# Patient Record
Sex: Male | Born: 1985 | Race: Black or African American | Hispanic: No | Marital: Single | Smoking: Never smoker
Health system: Southern US, Community
[De-identification: ages and names within clinical notes are randomized; demographics above are authoritative.]

## PROBLEM LIST (undated history)

## (undated) HISTORY — PX: TONSILLECTOMY: SUR1361

---

## 2020-05-15 ENCOUNTER — Emergency Department
Admission: EM | Admit: 2020-05-15 | Discharge: 2020-05-15 | Disposition: A | Discharge status: Home / Self Care | Payer: Self-pay | Attending: Emergency Medicine | Admitting: Emergency Medicine

## 2020-05-15 ENCOUNTER — Encounter: Payer: Self-pay | Admitting: Emergency Medicine

## 2020-05-15 ENCOUNTER — Other Ambulatory Visit: Payer: Self-pay

## 2020-05-15 ENCOUNTER — Emergency Department: Payer: Self-pay

## 2020-05-15 DIAGNOSIS — J209 Acute bronchitis, unspecified: Secondary | ICD-10-CM

## 2020-05-15 DIAGNOSIS — Z20822 Contact with and (suspected) exposure to covid-19: Secondary | ICD-10-CM | POA: Insufficient documentation

## 2020-05-15 LAB — RESP PANEL BY RT-PCR (FLU A&B, COVID) ARPGX2
Influenza A by PCR: NEGATIVE
Influenza B by PCR: NEGATIVE
SARS Coronavirus 2 by RT PCR: NEGATIVE

## 2020-05-15 MED ORDER — BENZONATATE 200 MG PO CAPS: 200.0000 mg | ORAL_CAPSULE | Freq: Three times a day (TID) | ORAL | 0 refills | Status: DC | PRN

## 2020-05-15 MED ORDER — AZITHROMYCIN 250 MG PO TABS: ORAL_TABLET | ORAL | 0 refills | Status: DC

## 2020-05-15 MED ORDER — PREDNISONE 10 MG (21) PO TBPK: ORAL_TABLET | ORAL | 0 refills | Status: AC

## 2020-05-15 NOTE — ED Notes (Signed)
See triage note  Presents with cough,sinus pressure and fever   Sx's started couple of days ago  Currently afebrile

## 2020-05-15 NOTE — ED Provider Notes (Signed)
[facility] Emergency Department Provider Note  ____________________________________________   Event Date/Time   First MD Initiated Contact with Patient 05/15/20 1142     (approximate)  I have reviewed the triage vital signs and the nursing notes.   HISTORY  Chief Complaint URI    HPI Kelley Knoth is a 35 y.o. male presents to the emergency department with URI symptoms for 14 days.   Is complaining of cough, congestion, fever, chills, denies chest pain, shortness of breath close contact with Covid19+ patient, patient states that he needs a work note as he has been awake for the past 2 weeks.  States at this point I feel like I need an antibiotic because it is going on for so long.  He denies chest pain/shortness of breath at this time   History reviewed. No pertinent past medical history.  There are no problems to display for this patient.   Past Surgical History:  Procedure Laterality Date  . TONSILLECTOMY      Prior to Admission medications   Medication Sig Start Date End Date Taking? Authorizing Provider  azithromycin (ZITHROMAX Z-PAK) 250 MG tablet 2 pills today then 1 pill a day for 4 days 05/15/20  Yes Alvis Edgell, Roselyn Bering, PA-C  benzonatate (TESSALON) 200 MG capsule Take 1 capsule (200 mg total) by mouth 3 (three) times daily as needed for cough. 05/15/20  Yes Korin Setzler, Roselyn Bering, PA-C  predniSONE (STERAPRED UNI-PAK 21 TAB) 10 MG (21) TBPK tablet Take 6 pills on day one then decrease by 1 pill each day 05/15/20  Yes Faythe Ghee, PA-C    Allergies Patient has no known allergies.  No family history on file.  Social History Social History   Tobacco Use  . Smoking status: Never Smoker  . Smokeless tobacco: Never Used    Review of Systems  Constitutional: Positive fever/chills Eyes: No visual changes. ENT: No sore throat. Respiratory: Positive cough Cardiovascular: No chest pain Gastrointestinal: No abdominal pain Genitourinary: Negative  for dysuria. Musculoskeletal: Negative for back pain. Skin: Negative for rash. Neurological: no neurological changes    ____________________________________________   PHYSICAL EXAM:  VITAL SIGNS: ED Triage Vitals  Enc Vitals Group     BP 05/15/20 1152 138/78     Pulse Rate 05/15/20 1152 76     Resp 05/15/20 1152 15     Temp 05/15/20 1152 98.2 F (36.8 C)     Temp Source 05/15/20 1152 Oral     SpO2 05/15/20 1152 100 %     Weight 05/15/20 1139 210 lb (95.3 kg)     Height 05/15/20 1139 5\' 10"  (1.778 m)     Head Circumference --      Peak Flow --      Pain Score 05/15/20 1152 8     Pain Loc --      Pain Edu? --      Excl. in GC? --     Constitutional: Alert and oriented. Well appearing and in no acute distress. Eyes: Conjunctivae are normal.  Head: Atraumatic. Nose: No congestion/rhinnorhea. Mouth/Throat: Mucous membranes are moist.   Neck:  supple no lymphadenopathy noted Cardiovascular: Normal rate, regular rhythm. Heart sounds are normal Respiratory: Normal respiratory effort.  No retractions, lungs CTA GU: deferred Musculoskeletal: FROM all extremities, warm and well perfused Neurologic:  Normal speech and language.  Skin:  Skin is warm, dry and intact. No rash noted. Psychiatric: Mood and affect are normal. Speech and behavior are normal.  ____________________________________________  LABS (all labs ordered are listed, but only abnormal results are displayed)  Labs Reviewed  RESP PANEL BY RT-PCR (FLU A&B, COVID) ARPGX2   ____________________________________________   ____________________________________________  RADIOLOGY  Chest x-ray  ____________________________________________   PROCEDURES  Procedure(s) performed: No  Procedures    ____________________________________________   INITIAL IMPRESSION / ASSESSMENT AND PLAN / ED COURSE  Pertinent labs & imaging results that were available during my care of the patient were reviewed by me  and considered in my medical decision making (see chart for details).   Patient is a 35 year old male who complains of URI symptoms.  Exam is consistent with covid.    Negative test for covid/flu  Chest x-ray reviewed by me confirmed by radiology to appear normal.  Did explain the findings to the patient.  He was given a prescription for Z-Pak, Tessalon Perles, Sterapred.  He is to follow-up with his regular doctor if not improving in 2 to 3 days.  He was given a work note.  He is discharged stable condition.   The patient was instructed to quarantine themselves at home.  Follow-up with your regular doctor if any concerns.  Return emergency department for worsening. OTC measures discussed     Toy Eisemann was evaluated in Emergency Department on 05/15/2020 for the symptoms described in the history of present illness. He was evaluated in the context of the global COVID-19 pandemic, which necessitated consideration that the patient might be at risk for infection with the SARS-CoV-2 virus that causes COVID-19. Institutional protocols and algorithms that pertain to the evaluation of patients at risk for COVID-19 are in a state of rapid change based on information released by regulatory bodies including the CDC and federal and state organizations. These policies and algorithms were followed during the patient's care in the ED.   As part of my medical decision making, I reviewed the following data within the electronic MEDICAL RECORD NUMBER Nursing notes reviewed and incorporated, Labs reviewed , Old chart reviewed, Radiograph reviewed , Notes from prior ED visits and [place] Controlled Substance Database  ____________________________________________   FINAL CLINICAL IMPRESSION(S) / ED DIAGNOSES  Final diagnoses:  Acute bronchitis, unspecified organism      NEW MEDICATIONS STARTED DURING THIS VISIT:  New Prescriptions   AZITHROMYCIN (ZITHROMAX Z-PAK) 250 MG TABLET    2 pills today then 1 pill a day  for 4 days   BENZONATATE (TESSALON) 200 MG CAPSULE    Take 1 capsule (200 mg total) by mouth 3 (three) times daily as needed for cough.   PREDNISONE (STERAPRED UNI-PAK 21 TAB) 10 MG (21) TBPK TABLET    Take 6 pills on day one then decrease by 1 pill each day     Note:  This document was prepared using Dragon voice recognition software and may include unintentional dictation errors.    Faythe Ghee, PA-C 05/15/20 1331    Merwyn Katos, MD 05/20/20 [phone]

## 2020-05-15 NOTE — ED Triage Notes (Signed)
C/O cough, chills, sinus congestion, fever x 2 weeks.

## 2020-07-24 ENCOUNTER — Emergency Department
Admission: EM | Admit: 2020-07-24 | Discharge: 2020-07-24 | Disposition: A | Discharge status: Home / Self Care | Payer: BLUE CROSS/BLUE SHIELD | Attending: Emergency Medicine | Admitting: Emergency Medicine

## 2020-07-24 ENCOUNTER — Emergency Department: Payer: BLUE CROSS/BLUE SHIELD

## 2020-07-24 ENCOUNTER — Other Ambulatory Visit: Payer: Self-pay

## 2020-07-24 DIAGNOSIS — M5412 Radiculopathy, cervical region: Secondary | ICD-10-CM | POA: Insufficient documentation

## 2020-07-24 DIAGNOSIS — M4802 Spinal stenosis, cervical region: Secondary | ICD-10-CM | POA: Insufficient documentation

## 2020-07-24 DIAGNOSIS — M542 Cervicalgia: Secondary | ICD-10-CM | POA: Diagnosis present

## 2020-07-24 MED ORDER — OXYCODONE-ACETAMINOPHEN 5-325 MG PO TABS
1.0000 | ORAL_TABLET | Freq: Once | ORAL | Status: AC
  Administered 2020-07-24: 1 via ORAL
  Filled 2020-07-24: qty 1

## 2020-07-24 MED ORDER — GABAPENTIN 300 MG PO CAPS: 300.0000 mg | ORAL_CAPSULE | Freq: Every day | ORAL | 0 refills | Status: AC

## 2020-07-24 NOTE — ED Provider Notes (Signed)
[facility] EMERGENCY DEPARTMENT Provider Note   CSN: [id number] Arrival date & time: 07/24/20  1452     History Chief Complaint  Patient presents with   Torticollis    Jason David is a 35 y.o. male presents to the emergency department for evaluation of left-sided neck pain, burning, weakness and numbness in the left arm.  6 months ago patient lifted a bag at work, felt pain and discomfort on the left side of his neck and into the left shoulder.  Symptoms have been stable up until 3 weeks ago when he developed weakness and numbness in the left arm, patient states his left hand is numb and he has pain numbness and tingling shooting down the left arm.  He has weakness with lifting his shoulder as well as performing a bicep curl.  He has decreased grip strength on the left side.  He denies any chest pain, shortness of breath or back pain.  He is currently on a prednisone taper started several days ago along with a muscle relaxer with little relief.  Pain is moderate to severe and has been increasing.  He denies any fevers.  Did have x-rays performed by orthopedic urgent care but are not HPI     History reviewed. No pertinent past medical history.  There are no problems to display for this patient.   Past Surgical History:  Procedure Laterality Date   TONSILLECTOMY         No family history on file.  Social History   Tobacco Use   Smoking status: Never   Smokeless tobacco: Never  Substance Use Topics   Alcohol use: Yes   Drug use: Never    Home Medications Prior to Admission medications   Medication Sig Start Date End Date Taking? Authorizing Provider  gabapentin (NEURONTIN) 300 MG capsule Take 1 capsule (300 mg total) by mouth at bedtime. 07/24/20 07/24/21 Yes Evon Slack, PA-C  methocarbamol (ROBAXIN) 500 MG tablet Take 500 mg by mouth 4 (four) times daily.   Yes [provider]  predniSONE (STERAPRED UNI-PAK 21 TAB) 10 MG (21) TBPK tablet  Take 6 pills on day one then decrease by 1 pill each day 05/15/20   Faythe Ghee, PA-C    Allergies    Ivp dye [iodinated diagnostic agents]  Review of Systems   Review of Systems  Constitutional:  Negative for chills and fever.  Respiratory:  Negative for shortness of breath.   Cardiovascular:  Negative for chest pain and palpitations.  Gastrointestinal:  Negative for nausea and vomiting.  Musculoskeletal:  Positive for neck pain. Negative for back pain.  Skin:  Negative for rash.  Neurological:  Positive for weakness and numbness. Negative for dizziness, syncope, light-headedness and headaches.   Physical Exam Updated Vital Signs BP 127/81   Pulse 84   Temp 98.6 F (37 C) (Oral)   Resp 19   Ht 6\' 2"  (1.88 m)   Wt 102.1 kg   SpO2 93%   BMI 28.89 kg/m   Physical Exam Constitutional:      Appearance: He is well-developed.  HENT:     Head: Normocephalic and atraumatic.  Eyes:     Conjunctiva/sclera: Conjunctivae normal.  Cardiovascular:     Rate and Rhythm: Normal rate.  Pulmonary:     Effort: Pulmonary effort is normal. No respiratory distress.  Musculoskeletal:     Cervical back: Normal range of motion.     Comments: Limited range of motion of the cervical  spine the left-sided paravertebral muscle tenderness.  He has inability to fully extend the neck, neck extension reproduces left arm/hand numbness and tingling.  Patient has decreased supraspinatus, biceps triceps and grip strength on the left side.  He has diminished sensation on the dorsal aspect of the left hand mostly along the middle finger.  He has 2+ radial pulse.  Compartments are soft.  Good internal and external rotation with no discomfort of the shoulder.  Skin:    General: Skin is warm.     Findings: No rash.  Neurological:     Mental Status: He is alert and oriented to person, place, and time.  Psychiatric:        Behavior: Behavior normal.        Thought Content: Thought content normal.    ED  Results / Procedures / Treatments   Labs (all labs ordered are listed, but only abnormal results are displayed) Labs Reviewed - No data to display  EKG None  Radiology MR Cervical Spine Wo Contrast  Result Date: 07/24/2020 CLINICAL DATA:  Left-sided neck pain EXAM: MRI CERVICAL SPINE WITHOUT CONTRAST TECHNIQUE: Multiplanar, multisequence MR imaging of the cervical spine was performed. No intravenous contrast was administered. COMPARISON:  None. FINDINGS: Motion degraded study. Alignment: Reversal of normal cervical lordosis. Vertebrae: No fracture, evidence of discitis, or bone lesion. Cord: Normal signal and morphology. Posterior Fossa, vertebral arteries, paraspinal tissues: Negative. Disc levels: C1-C2: Normal. C2-C3: Normal disc space and facets. No spinal canal or neuroforaminal stenosis. C3-C4: Normal disc space and facets. No spinal canal or neuroforaminal stenosis. C4-C5: Small left subarticular disc protrusion contacting the ventral spinal cord. No spinal canal or neural foraminal stenosis. C5-C6: Small central disc protrusion effaces the ventral thecal sac. Mild spinal canal stenosis. Limited assessment of the neural foramina due to motion. C6-C7: Small central disc protrusion indenting the ventral spinal cord. Mild spinal canal stenosis. Limited assessment of the neural foramina due to motion. C7-T1: Normal disc space and facets. No spinal canal or neuroforaminal stenosis. IMPRESSION: 1. Motion degraded study. 2. Small central disc protrusions at C5-C6 and C6-C7 with mild spinal canal stenosis. 3. Limited assessment of the neural foramina at C5-C6 and C6-C7 due to motion. Electronically Signed   By: Deatra Robinson M.D.   On: 07/24/2020 21:11    Procedures Procedures   Medications Ordered in ED Medications  oxyCODONE-acetaminophen (PERCOCET/ROXICET) 5-325 MG per tablet 1 tablet (1 tablet Oral Given 07/24/20 1735)    ED Course  I have reviewed the triage vital signs and the nursing  notes.  Pertinent labs & imaging results that were available during my care of the patient were reviewed by me and considered in my medical decision making (see chart for details).    MDM Rules/Calculators/A&P                          35 year old male with left cervical radiculopathy and weakness of the left upper extremity.  MRI showed mild cervical canal stenosis with no severe nerve impingement.  Patient with normal vital signs, pain improved with oxycodone as well as some of his physical exam findings as far slight improvement with strength.  Patient will continue with prednisone taper, muscle relaxer and will continue with his physical therapy.  Recommend following up with [facility].  He understands signs and symptoms to return to the ER for. Final Clinical Impression(s) / ED Diagnoses Final diagnoses:  Left cervical radiculopathy  Spinal stenosis in cervical region  Rx / DC Orders ED Discharge Orders          Ordered    gabapentin (NEURONTIN) 300 MG capsule  Daily at bedtime        07/24/20 2137             Ronnette Juniper 07/24/20 2139    Sharman Cheek, MD 07/28/20 [phone]

## 2020-07-24 NOTE — ED Notes (Signed)
Pt notified his fiance called, and updated on POC

## 2020-07-24 NOTE — ED Notes (Signed)
Pt. In MRI, fiance called, updated on POC.

## 2020-07-24 NOTE — ED Triage Notes (Signed)
Pt to ER via POV. Reports waking up with left sided neck pain and stiffness for the last six months after laying on it wrong. Pt reports using muscle relaxers and prednisone. Also reports left sided shoulder pain and stiffness. Reports having imaging done at [facility] approx 1 week ago and was diagnosed with a trapped nerve.

## 2020-07-24 NOTE — Discharge Instructions (Addendum)
Please continue with prednisone, muscle relaxer and start taking gabapentin at bedtime.  You can also take Tylenol 1000 mg every 6 hours as needed.  Call [facility] to schedule follow-up appointment.  Return to the ER for any worsening symptoms or urgent changes in your health.

## 2020-12-29 ENCOUNTER — Other Ambulatory Visit: Payer: Self-pay

## 2020-12-29 ENCOUNTER — Emergency Department
Admission: EM | Admit: 2020-12-29 | Discharge: 2020-12-29 | Disposition: A | Discharge status: Home / Self Care | Payer: BLUE CROSS/BLUE SHIELD | Attending: Emergency Medicine | Admitting: Emergency Medicine

## 2020-12-29 DIAGNOSIS — Z20822 Contact with and (suspected) exposure to covid-19: Secondary | ICD-10-CM | POA: Insufficient documentation

## 2020-12-29 DIAGNOSIS — B349 Viral infection, unspecified: Secondary | ICD-10-CM | POA: Insufficient documentation

## 2020-12-29 LAB — RESP PANEL BY RT-PCR (FLU A&B, COVID) ARPGX2
Influenza A by PCR: NEGATIVE
Influenza B by PCR: NEGATIVE
SARS Coronavirus 2 by RT PCR: NEGATIVE

## 2020-12-29 LAB — GROUP A STREP BY PCR: Group A Strep by PCR: NOT DETECTED

## 2020-12-29 NOTE — ED Provider Notes (Signed)
[facility] Emergency Department Provider Note  ____________________________________________  Time seen: Approximately 12:21 PM  I have reviewed the triage vital signs and the nursing notes.   HISTORY  Chief Complaint Sore Throat   HPI Angel Weedon is a 35 y.o. male presents to the emergency department for treatment and evaluation of congestion, sore throat, and cough x 2 days.     History reviewed. No pertinent past medical history.  There are no problems to display for this patient.   Past Surgical History:  Procedure Laterality Date   TONSILLECTOMY      Prior to Admission medications   Medication Sig Start Date End Date Taking? Authorizing Provider  gabapentin (NEURONTIN) 300 MG capsule Take 1 capsule (300 mg total) by mouth at bedtime. 07/24/20 07/24/21  Evon Slack, PA-C  methocarbamol (ROBAXIN) 500 MG tablet Take 500 mg by mouth 4 (four) times daily.    [provider]  predniSONE (STERAPRED UNI-PAK 21 TAB) 10 MG (21) TBPK tablet Take 6 pills on day one then decrease by 1 pill each day 05/15/20   Faythe Ghee, PA-C    Allergies Ivp dye [iodinated diagnostic agents]  No family history on file.  Social History Social History   Tobacco Use   Smoking status: Never   Smokeless tobacco: Never  Substance Use Topics   Alcohol use: Yes   Drug use: Never    Review of Systems Constitutional: Positive for  fever/chills. Decreased appetite. ENT: Positive for sore throat. Cardiovascular: Denies chest pain. Respiratory: negative for shortness of breath. Positive for cough. Negative for wheezing.  Gastrointestinal: Negative for nausea,  no vomiting.  no diarrhea.  Musculoskeletal: Positive for body aches Skin: Negative for rash. Neurological: Positive for headaches ____________________________________________   PHYSICAL EXAM:  VITAL SIGNS: ED Triage Vitals  Enc Vitals Group     BP 12/29/20 1208 (!) 137/95     Pulse Rate  12/29/20 1208 79     Resp 12/29/20 1208 20     Temp 12/29/20 1208 98.6 F (37 C)     Temp Source 12/29/20 1208 Oral     SpO2 12/29/20 1208 96 %     Weight 12/29/20 1207 215 lb (97.5 kg)     Height 12/29/20 1207 6' (1.829 m)     Head Circumference --      Peak Flow --      Pain Score 12/29/20 1207 5     Pain Loc --      Pain Edu? --      Excl. in GC? --     Constitutional: Alert and oriented. Overall well appearing and in no acute distress. Eyes: Conjunctivae are normal. Ears: TM normal Nose: Maxillary sinus congestion noted; clear rhinnorhea. Mouth/Throat: Mucous membranes are moist.  Oropharynx erythematous. Tonsils without exudate. Uvula midline. Neck: No stridor.  Lymphatic: No cervical lymphadenopathy. Cardiovascular: Normal rate, regular rhythm. Good peripheral circulation. Respiratory: Respirations are even and unlabored.  No retractions. Breath sounds clear. Gastrointestinal: Soft and nontender.  Musculoskeletal: FROM x 4 extremities.  Neurologic:  Normal speech and language. Skin:  Skin is warm, dry and intact. No rash noted. Psychiatric: Mood and affect are normal. Speech and behavior are normal.  ____________________________________________   LABS (all labs ordered are listed, but only abnormal results are displayed)  Labs Reviewed  RESP PANEL BY RT-PCR (FLU A&B, COVID) ARPGX2  GROUP A STREP BY PCR   ____________________________________________  EKG  Not indicated. ____________________________________________  RADIOLOGY  Not indicated. ____________________________________________  PROCEDURES  Procedure(s) performed: None  Critical Care performed: No ____________________________________________   INITIAL IMPRESSION / ASSESSMENT AND PLAN / ED COURSE  35 y.o. male presents to the ER for viral symptoms as described in the HPI. COVID, influenza, and strep are all negative. Symptomatic treatment advised. If not improving over the week, he is to  follow up with primary care.   Medications - No data to display  ED Discharge Orders     None        Pertinent labs & imaging results that were available during my care of the patient were reviewed by me and considered in my medical decision making (see chart for details).    If controlled substance prescribed during this visit, 12 month history viewed on the [facility] prior to issuing an initial prescription for Schedule II or III opiod. ____________________________________________   FINAL CLINICAL IMPRESSION(S) / ED DIAGNOSES  Final diagnoses:  Acute viral syndrome    Note:  This document was prepared using Dragon voice recognition software and may include unintentional dictation errors.     Chinita Pester, FNP 01/02/21 1514    Sharyn Creamer, MD 01/06/21 1152

## 2020-12-29 NOTE — ED Notes (Signed)
Pt with congestion, sore throat and trouble sleeping x 5 days. Pt able to walk to room gait steady. No SOB noted.

## 2020-12-29 NOTE — ED Triage Notes (Signed)
Pt states he has been feeling back since Tuesday- pt has congestion, cough, and sore throat

## 2022-06-11 ENCOUNTER — Emergency Department: Payer: 59

## 2022-06-11 ENCOUNTER — Other Ambulatory Visit: Payer: Self-pay

## 2022-06-11 ENCOUNTER — Emergency Department
Admission: EM | Admit: 2022-06-11 | Discharge: 2022-06-11 | Disposition: A | Discharge status: Home / Self Care | Payer: 59 | Attending: Emergency Medicine | Admitting: Emergency Medicine

## 2022-06-11 DIAGNOSIS — Y92410 Unspecified street and highway as the place of occurrence of the external cause: Secondary | ICD-10-CM | POA: Diagnosis not present

## 2022-06-11 DIAGNOSIS — M62838 Other muscle spasm: Secondary | ICD-10-CM

## 2022-06-11 DIAGNOSIS — M542 Cervicalgia: Secondary | ICD-10-CM | POA: Diagnosis present

## 2022-06-11 MED ORDER — NAPROXEN 500 MG PO TABS: 500.0000 mg | ORAL_TABLET | Freq: Two times a day (BID) | ORAL | 0 refills | Status: AC

## 2022-06-11 MED ORDER — LIDOCAINE 5 % EX PTCH
1.0000 | MEDICATED_PATCH | CUTANEOUS | Status: DC
  Administered 2022-06-11: 1 via TRANSDERMAL
  Filled 2022-06-11: qty 1

## 2022-06-11 MED ORDER — CYCLOBENZAPRINE HCL 5 MG PO TABS: 5.0000 mg | ORAL_TABLET | Freq: Three times a day (TID) | ORAL | 0 refills | Status: AC | PRN

## 2022-06-11 MED ORDER — KETOROLAC TROMETHAMINE 15 MG/ML IJ SOLN
15.0000 mg | Freq: Once | INTRAMUSCULAR | Status: DC
  Filled 2022-06-11: qty 1

## 2022-06-11 MED ORDER — CYCLOBENZAPRINE HCL 10 MG PO TABS
5.0000 mg | ORAL_TABLET | Freq: Once | ORAL | Status: AC
  Administered 2022-06-11: 5 mg via ORAL
  Filled 2022-06-11: qty 1

## 2022-06-11 MED ORDER — LIDOCAINE 5 % EX PTCH: 1.0000 | MEDICATED_PATCH | Freq: Two times a day (BID) | CUTANEOUS | 0 refills | Status: AC
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
## 2022-06-11 NOTE — Discharge Instructions (Addendum)
Your CT scan and x-ray were normal.  You may continue to take Tylenol/ibuprofen in addition to the muscle relaxants (Flexeril) as needed for pain.  Remember that you cannot drive, operate heavy machinery, or perform tasks require concentration will take this medication.  Return for any new, worsening, or change in symptoms or other concerns.  It was a pleasure caring for you today.

## 2022-09-28 IMAGING — MR MR CERVICAL SPINE W/O CM
7 series · 43 of 48 positions shown · non-contrast
Comparison: None.

CLINICAL DATA: Left-sided neck pain

EXAM:
MRI CERVICAL SPINE WITHOUT CONTRAST
TECHNIQUE: Multiplanar, multisequence MR imaging of the cervical spine was
performed. No intravenous contrast was administered.

[Series 5: T2 · sagittal · 3.0mm · 0.62mm/px · 6 of 15 slices shown (1 of 3)]
[im 1/15]
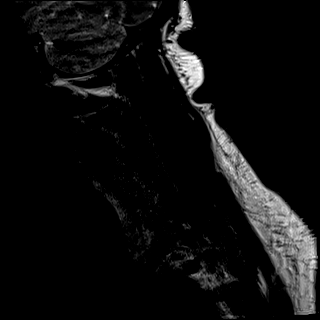
[im 3/15]
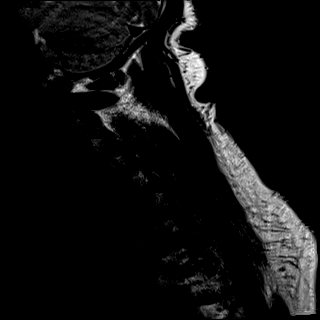
[im 6/15]
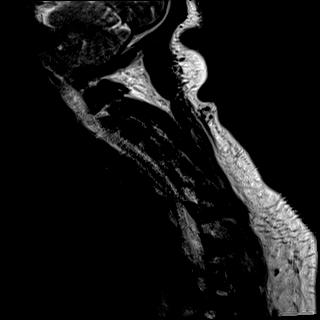
[im 9/15]
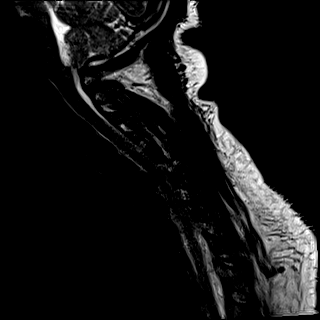
[im 12/15]
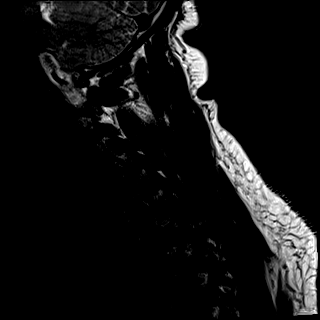
[im 15/15]
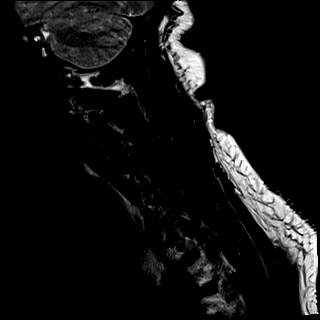

[Series 10: FLAIR · sagittal · 3.0mm · 0.78mm/px · 5 of 15 slices shown]
[im 1/15]
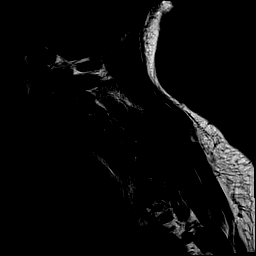
[im 4/15]
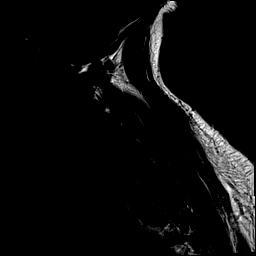
[im 8/15]
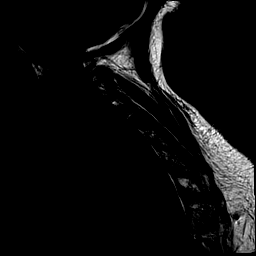
[im 11/15]
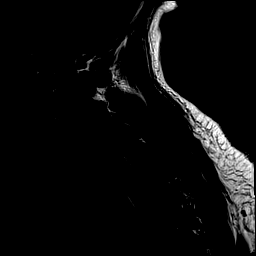
[im 15/15]
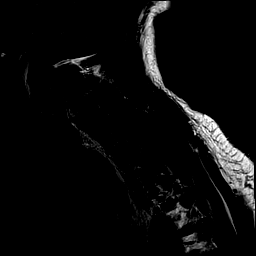

[Series 11: STIR · sagittal · 3.0mm · 0.62mm/px · 5 of 14 slices shown (1 of 2)]
[im 1/14]
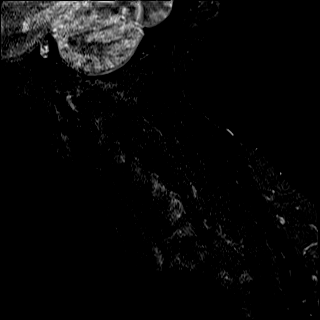
[im 4/14]
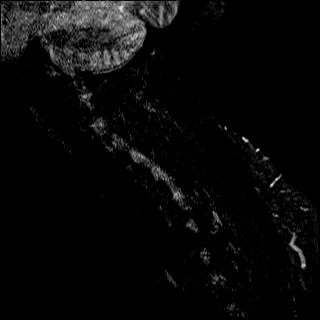
[im 7/14]
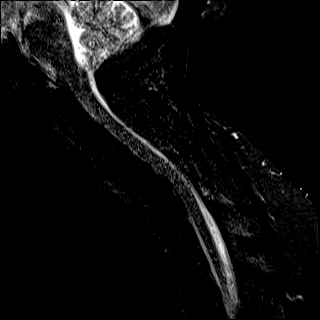
[im 10/14]
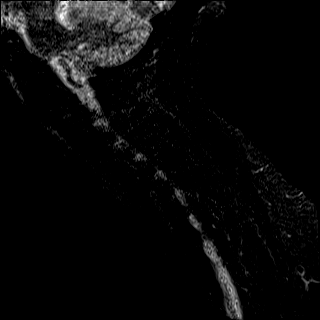
[im 14/14]
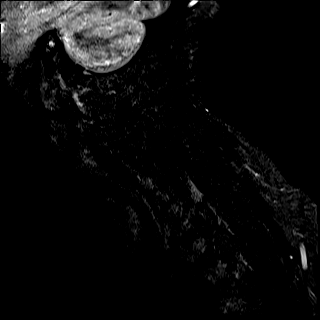

[Series 12: T2 · sagittal · 3.0mm · 0.62mm/px · 5 of 15 slices shown (2 of 3)]
[im 1/15]
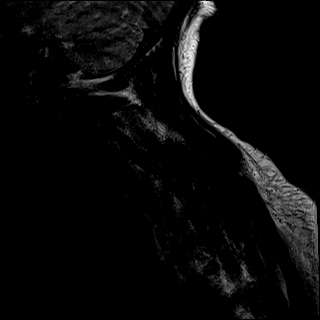
[im 4/15]
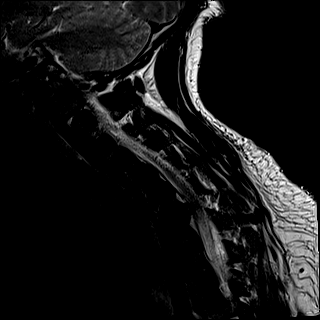
[im 8/15]
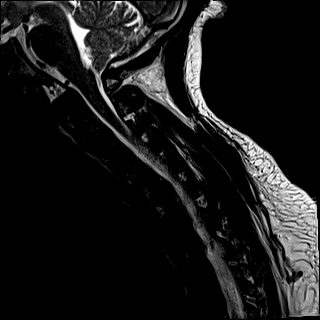
[im 11/15]
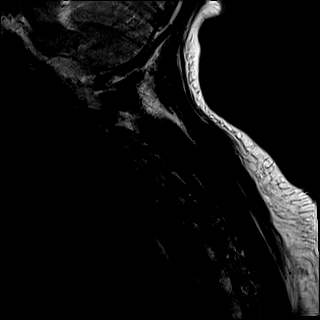
[im 15/15]
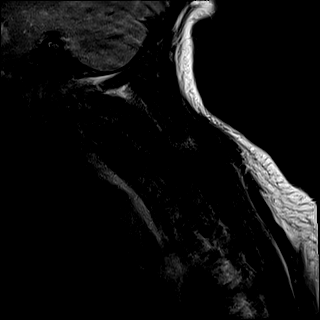

[Series 13: T2 · axial · 3.0mm · 0.70mm/px · z∈[-112,-33]mm · 11 of 29 slices shown (3 of 3)]
[im 1/29]
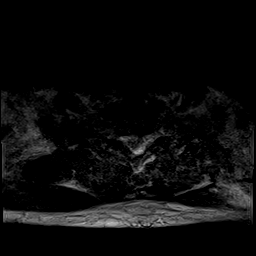
[im 3/29]
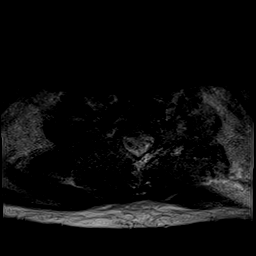
[im 6/29]
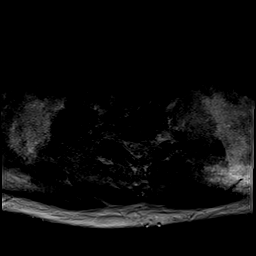
[im 9/29]
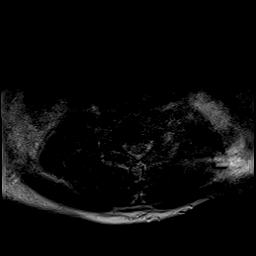
[im 12/29]
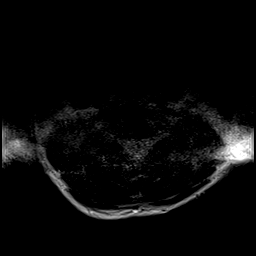
[im 15/29]
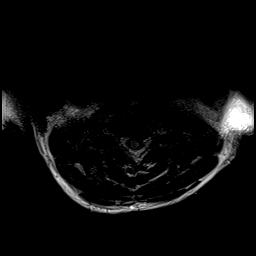
[im 17/29]
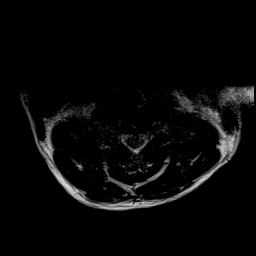
[im 20/29]
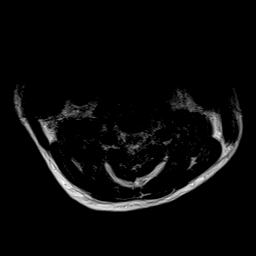
[im 23/29]
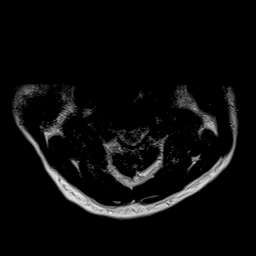
[im 26/29]
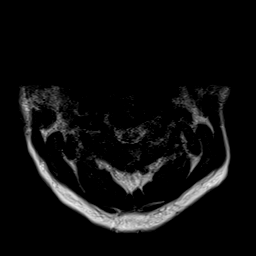
[im 29/29]
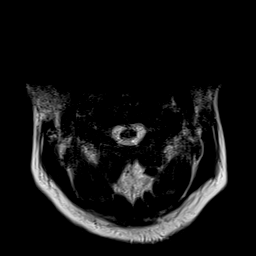

[Series 14: ax mpgr · axial · 3.0mm · 0.35mm/px · z∈[-112,-59]mm · 6 of 29 slices shown]
[im 1/29]
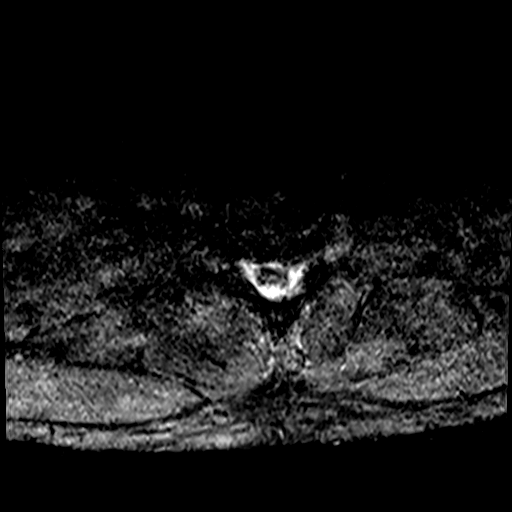
[im 6/29]
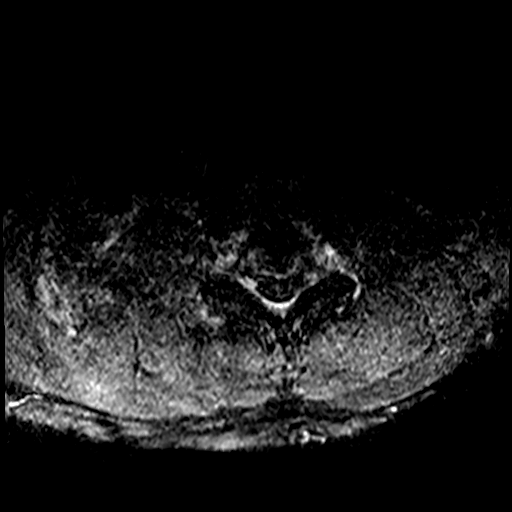
[im 9/29]
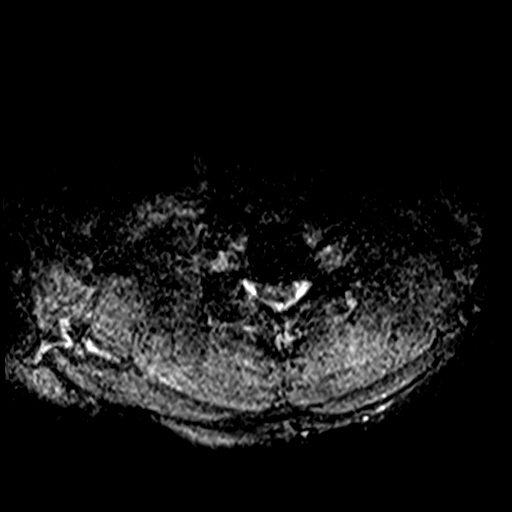
[im 12/29]
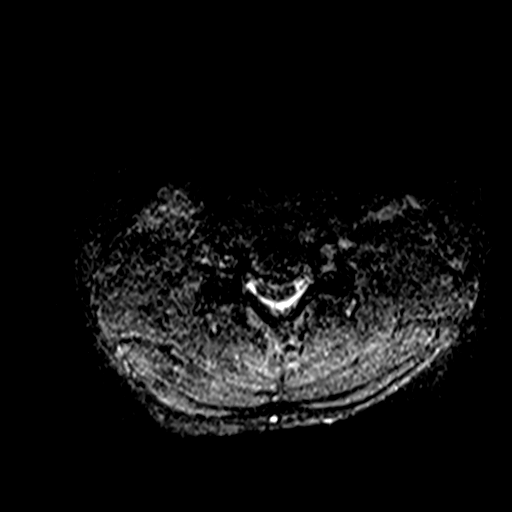
[im 17/29]
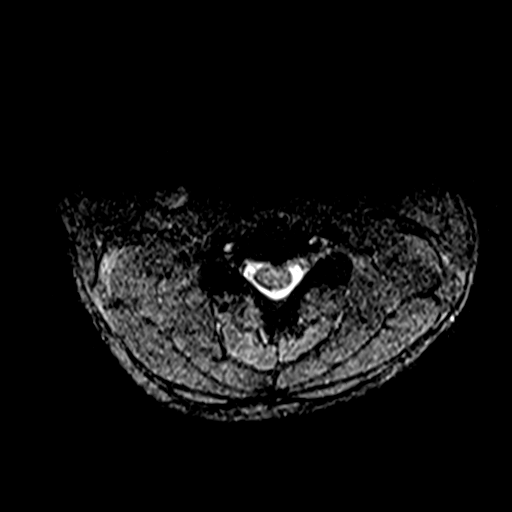
[im 20/29]
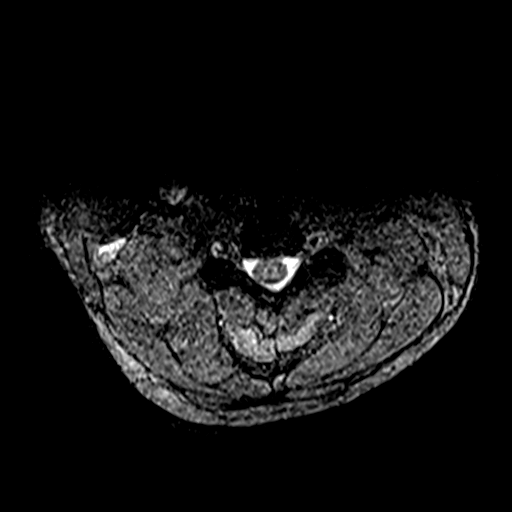

[Series 15: STIR · sagittal · 3.0mm · 0.89mm/px · 5 of 15 slices shown (2 of 2)]
[im 1/15]
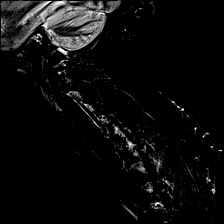
[im 4/15]
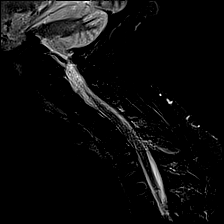
[im 8/15]
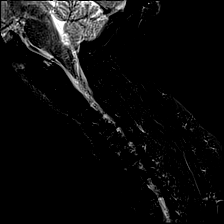
[im 11/15]
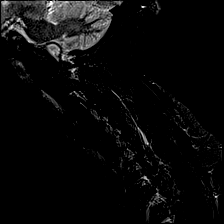
[im 15/15]
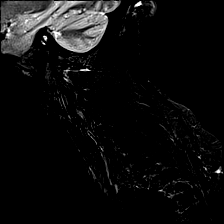

[43 of 48 positions shown; findings below may reference images not displayed]

FINDINGS: Motion degraded study.

Alignment: Reversal of normal cervical lordosis.

Vertebrae: No fracture, evidence of discitis, or bone lesion.

Cord: Normal signal and morphology.

Posterior Fossa, vertebral arteries, paraspinal tissues: Negative.

Disc levels:

C1-C2: Normal.

C2-C3: Normal disc space and facets. No spinal canal or
neuroforaminal stenosis.

C3-C4: Normal disc space and facets. No spinal canal or
neuroforaminal stenosis.

C4-C5: Small left subarticular disc protrusion contacting the
ventral spinal cord. No spinal canal or neural foraminal stenosis.

C5-C6: Small central disc protrusion effaces the ventral thecal sac.
Mild spinal canal stenosis. Limited assessment of the neural
foramina due to motion.

C6-C7: Small central disc protrusion indenting the ventral spinal
cord. Mild spinal canal stenosis. Limited assessment of the neural
foramina due to motion.

C7-T1: Normal disc space and facets. No spinal canal or
neuroforaminal stenosis.
IMPRESSION: 1. Motion degraded study.
2. Small central disc protrusions at C5-C6 and C6-C7 with mild
spinal canal stenosis.
3. Limited assessment of the neural foramina at C5-C6 and C6-C7 due
to motion.

## 2023-04-02 ENCOUNTER — Ambulatory Visit
Admission: EM | Admit: 2023-04-02 | Discharge: 2023-04-02 | Disposition: A | Discharge status: Home / Self Care | Attending: Family Medicine | Admitting: Family Medicine

## 2023-04-02 ENCOUNTER — Encounter: Payer: Self-pay | Admitting: Emergency Medicine

## 2023-04-02 DIAGNOSIS — B349 Viral infection, unspecified: Secondary | ICD-10-CM | POA: Diagnosis not present

## 2023-04-02 LAB — RESP PANEL BY RT-PCR (FLU A&B, COVID) ARPGX2
Influenza A by PCR: NEGATIVE
Influenza B by PCR: NEGATIVE
SARS Coronavirus 2 by RT PCR: NEGATIVE

## 2023-04-02 LAB — GROUP A STREP BY PCR: Group A Strep by PCR: NOT DETECTED

## 2023-04-02 NOTE — Discharge Instructions (Signed)

## 2023-04-02 NOTE — ED Triage Notes (Signed)
 Pt presents with a sore throat, congestion, bodyaches and bilateral pain x 3 days. His symptoms became worse this morning.

## 2023-04-02 NOTE — ED Provider Notes (Signed)
 [facility]    CSN: [id number] Arrival date & time: 04/02/23  1043      History   Chief Complaint Chief Complaint  Patient presents with   Nasal Congestion   Otalgia   Sore Throat   Generalized Body Aches   Fever    HPI Jason David is a 38 y.o. male  presents for evaluation of URI symptoms for 3 days. Patient reports associated symptoms of sore throat, congestion, body aches, ear pain, fever. Denies N/V/D, cough, shortness of breath. Patient does not have a hx of asthma. Patient is not an active smoker.   Reports also members with similar symptoms.  Pt has taken Tylenol and he had 1 leftover amoxicillin pill that he took for symptoms. Pt has no other concerns at this time.    Otalgia Associated symptoms: fever and sore throat   Sore Throat  Fever Associated symptoms: ear pain, myalgias and sore throat     History reviewed. No pertinent past medical history.  There are no active problems to display for this patient.   Past Surgical History:  Procedure Laterality Date   TONSILLECTOMY         Home Medications    Prior to Admission medications   Medication Sig Start Date End Date Taking? Authorizing Provider  cyclobenzaprine (FLEXERIL) 5 MG tablet Take 1 tablet (5 mg total) by mouth 3 (three) times daily as needed for muscle spasms. 06/11/22   Poggi, Eileen Stanford E, PA-C  gabapentin (NEURONTIN) 300 MG capsule Take 1 capsule (300 mg total) by mouth at bedtime. 07/24/20 07/24/21  Evon Slack, PA-C  methocarbamol (ROBAXIN) 500 MG tablet Take 500 mg by mouth 4 (four) times daily.    [provider]  predniSONE (STERAPRED UNI-PAK 21 TAB) 10 MG (21) TBPK tablet Take 6 pills on day one then decrease by 1 pill each day 05/15/20   Faythe Ghee, PA-C    Family History History reviewed. No pertinent family history.  Social History Social History   Tobacco Use   Smoking status: Never   Smokeless tobacco: Never  Vaping Use   Vaping status: Never Used   Substance Use Topics   Alcohol use: Yes   Drug use: Never     Allergies   Ivp dye [iodinated contrast media]   Review of Systems Review of Systems  Constitutional:  Positive for fever.  HENT:  Positive for ear pain and sore throat.   Musculoskeletal:  Positive for myalgias.     Physical Exam Triage Vital Signs ED Triage Vitals  Encounter Vitals Group     BP 04/02/23 1143 (!) 131/90     Systolic BP Percentile --      Diastolic BP Percentile --      Pulse Rate 04/02/23 1143 82     Resp 04/02/23 1143 16     Temp 04/02/23 1143 98.5 F (36.9 C)     Temp Source 04/02/23 1143 Oral     SpO2 04/02/23 1143 98 %     Weight --      Height --      Head Circumference --      Peak Flow --      Pain Score 04/02/23 1141 8     Pain Loc --      Pain Education --      Exclude from Growth Chart --    No data found.  Updated Vital Signs BP (!) 131/90 (BP Location: Left Arm)   Pulse 82  Temp 98.5 F (36.9 C) (Oral)   Resp 16   SpO2 98%   Visual Acuity Right Eye Distance:   Left Eye Distance:   Bilateral Distance:    Right Eye Near:   Left Eye Near:    Bilateral Near:     Physical Exam Vitals and nursing note reviewed.  Constitutional:      General: He is not in acute distress.    Appearance: Normal appearance. He is not ill-appearing or toxic-appearing.  HENT:     Head: Normocephalic and atraumatic.     Right Ear: Tympanic membrane and ear canal normal.     Left Ear: Tympanic membrane and ear canal normal.     Nose: Congestion present.     Mouth/Throat:     Mouth: Mucous membranes are moist.     Pharynx: Posterior oropharyngeal erythema present.  Eyes:     Pupils: Pupils are equal, round, and reactive to light.  Cardiovascular:     Rate and Rhythm: Normal rate and regular rhythm.     Heart sounds: Normal heart sounds.  Pulmonary:     Effort: Pulmonary effort is normal.     Breath sounds: Normal breath sounds.  Musculoskeletal:     Cervical back: Normal  range of motion and neck supple.  Lymphadenopathy:     Cervical: No cervical adenopathy.  Skin:    General: Skin is warm and dry.  Neurological:     General: No focal deficit present.     Mental Status: He is alert and oriented to person, place, and time.  Psychiatric:        Mood and Affect: Mood normal.        Behavior: Behavior normal.      UC Treatments / Results  Labs (all labs ordered are listed, but only abnormal results are displayed) Labs Reviewed  RESP PANEL BY RT-PCR (FLU A&B, COVID) [id number]  GROUP A STREP BY PCR    EKG   Radiology No results found.  Procedures Procedures (including critical care time)  Medications Ordered in UC Medications - No data to display  Initial Impression / Assessment and Plan / UC Course  I have reviewed the triage vital signs and the nursing notes.  Pertinent labs & imaging results that were available during my care of the patient were reviewed by me and considered in my medical decision making (see chart for details).     Reviewed exam and symptoms with patient.  No red flags.  Negative COVID, flu, strep PCR.  Discussed viral illness and symptomatic treatment.  He declined Rx cough medicine and will use OTC treatments.  Discussed salt water gargles and warm liquids such as teas and honey.  OTC analgesics as needed.  PCP follow-up if symptoms do not improve.  ER precautions reviewed. Final Clinical Impressions(s) / UC Diagnoses   Final diagnoses:  Viral illness     Discharge Instructions       Please treat your symptoms with over the counter cough medication, tylenol or ibuprofen, humidifier, and rest. Viral illnesses can last 7-14 days. Please follow up with your PCP if your symptoms are not improving. Please go to the ER for any worsening symptoms. This includes but is not limited to fever you can not control with tylenol or ibuprofen, you are not able to stay hydrated, you have shortness of breath or chest pain.  Thank you  for choosing [facility] for your healthcare needs. I hope you feel better soon!  ED Prescriptions   None    PDMP not reviewed this encounter.   Radford Pax, NP 04/02/23 1240
